# Patient Record
Sex: Male | Born: 1965 | Race: White | Hispanic: No | Marital: Married | Smoking: Never smoker
Health system: Southern US, Community
[De-identification: ages and names within clinical notes are randomized; demographics above are authoritative.]

---

## 1999-01-31 ENCOUNTER — Emergency Department (HOSPITAL_COMMUNITY): Admission: EM | Admit: 1999-01-31 | Discharge: 1999-01-31 | Payer: Self-pay | Admitting: Emergency Medicine

## 2002-09-06 ENCOUNTER — Ambulatory Visit (HOSPITAL_COMMUNITY): Admission: RE | Admit: 2002-09-06 | Discharge: 2002-09-06 | Payer: Self-pay | Admitting: Urology

## 2004-10-07 ENCOUNTER — Emergency Department (HOSPITAL_COMMUNITY): Admission: EM | Admit: 2004-10-07 | Discharge: 2004-10-08 | Payer: Self-pay | Admitting: Emergency Medicine

## 2005-11-11 ENCOUNTER — Encounter: Admission: RE | Admit: 2005-11-11 | Discharge: 2005-11-11 | Payer: Self-pay | Admitting: Family Medicine

## 2005-11-12 ENCOUNTER — Emergency Department (HOSPITAL_COMMUNITY): Admission: EM | Admit: 2005-11-12 | Discharge: 2005-11-12 | Payer: Self-pay | Admitting: Emergency Medicine

## 2005-11-24 ENCOUNTER — Encounter: Admission: RE | Admit: 2005-11-24 | Discharge: 2005-11-24 | Payer: Self-pay | Admitting: Neurology

## 2005-12-22 ENCOUNTER — Ambulatory Visit: Payer: Self-pay

## 2006-11-24 IMAGING — CT CT CHEST W/O CM
3 of 4 series · 18 of 31 positions shown, 20 images · non-contrast
Comparison: NONE

CLINICAL DATA: Irregular parenchymal density left upper lobe 
overlying  left 5th rib. 

CT CHEST WITHOUT INTRAVENOUS CONTRAST
TECHNIQUE: Multiple axial slices were obtained from 
the lung apex through the upper abdomen.  Lung, soft tissue, and 
bone window settings were obtained.

[Series 2: chest wo · axial · 0.71mm/px · z∈[+1372,+1612]mm · 8 of 65 slices shown, 10 images]
[im 9/65  mediastinal]
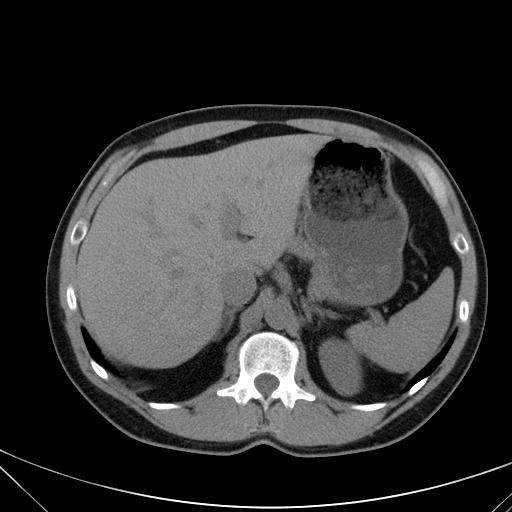
[im 9/65  lung]
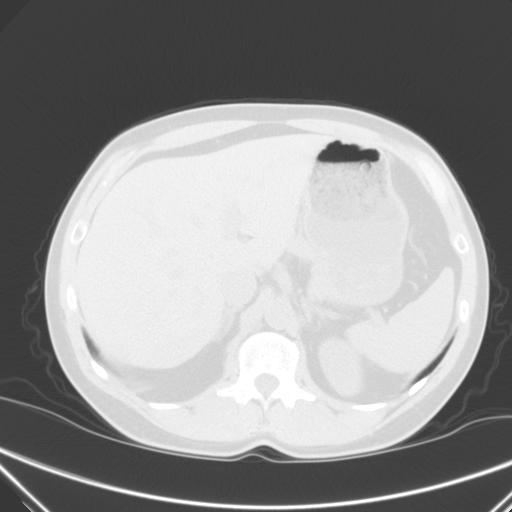
[im 17/65  lung]
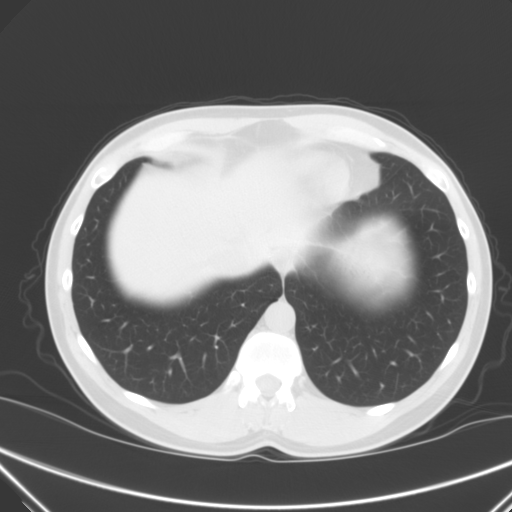
[im 25/65  lung]
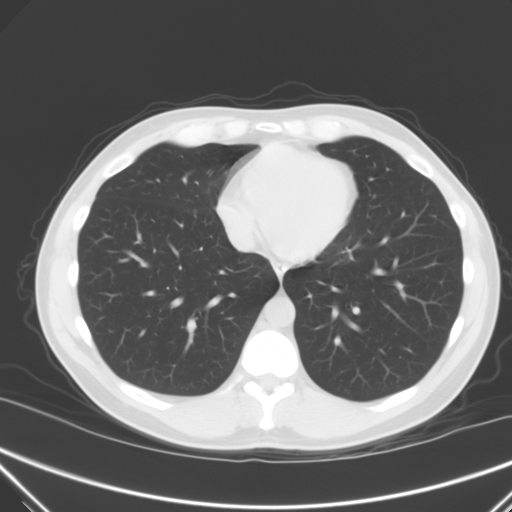
[im 31/65  lung]
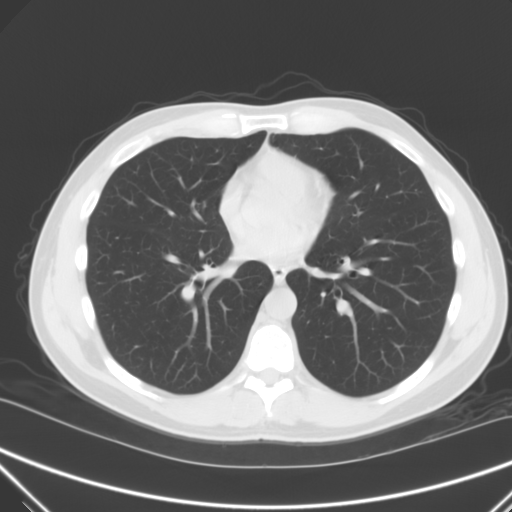
[im 33/65  mediastinal]
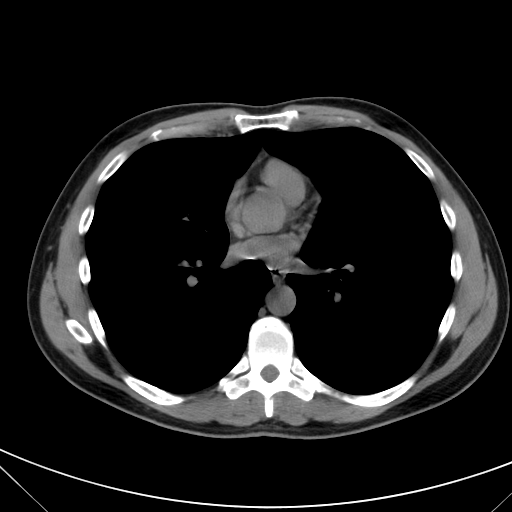
[im 33/65  lung]
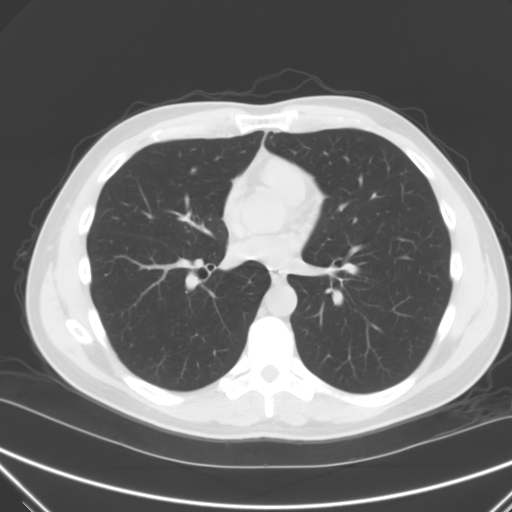
[im 41/65  lung]
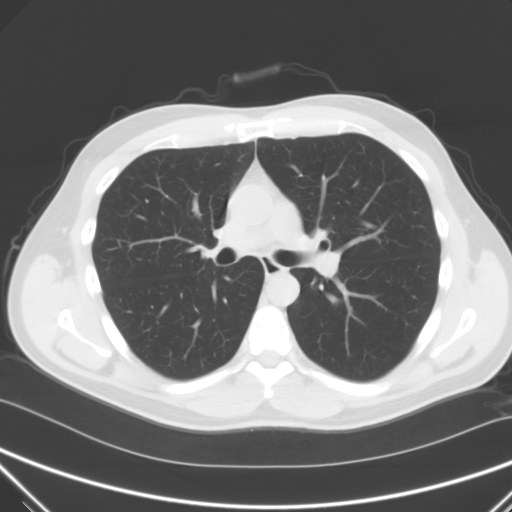
[im 49/65  lung]
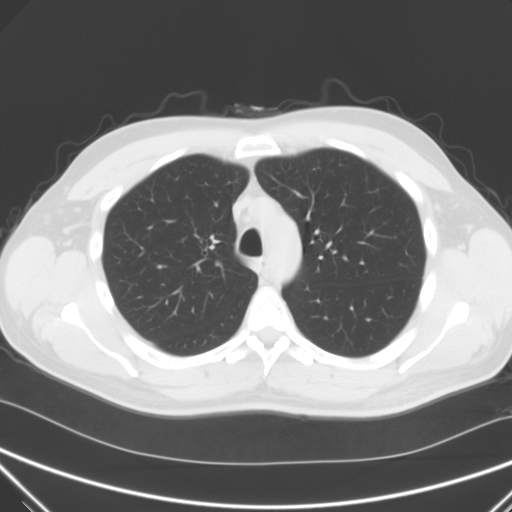
[im 57/65  lung]
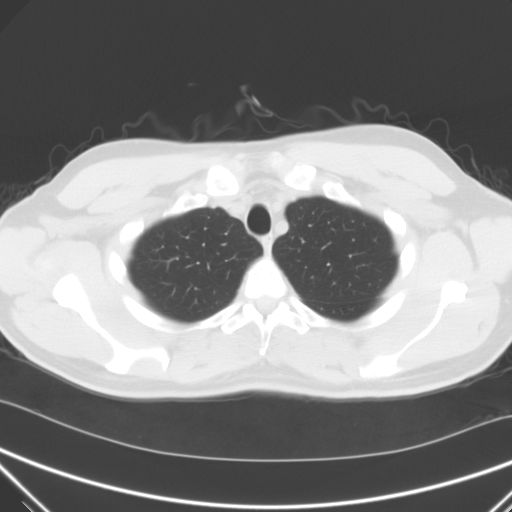

[Series 3: lung · axial · 0.71mm/px · z∈[+1386,+1432]mm · 2 of 62 slices shown]
[im 9/62  lung]
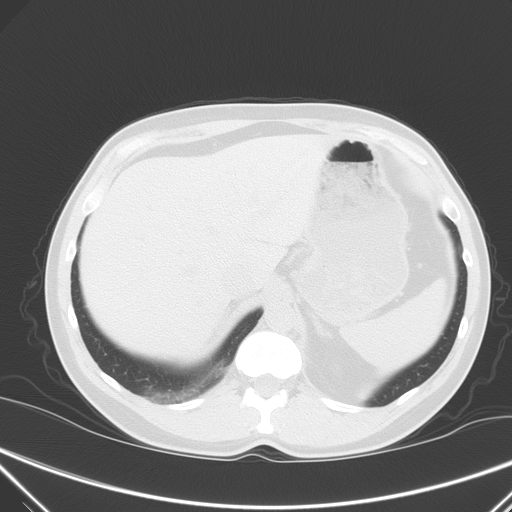
[im 18/62  lung]
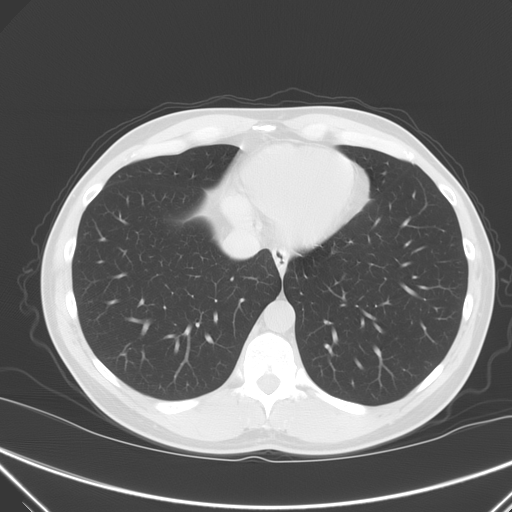

[Series 5: bone · axial · 0.71mm/px · z∈[+1372,+1612]mm · 8 of 65 slices shown]
[im 9/65  lung]
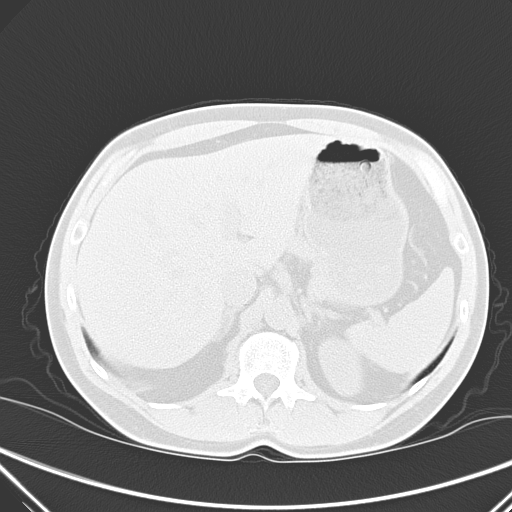
[im 17/65  lung]
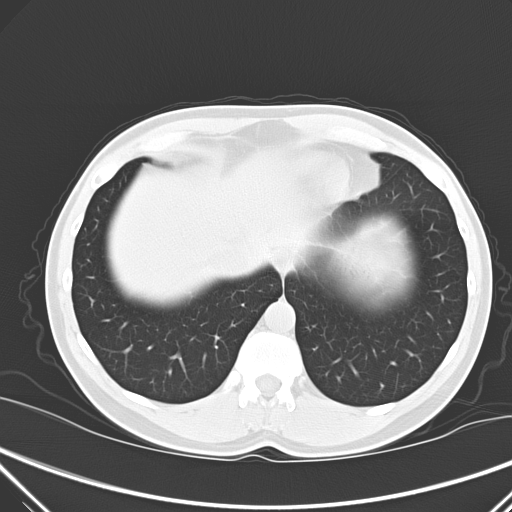
[im 25/65  lung]
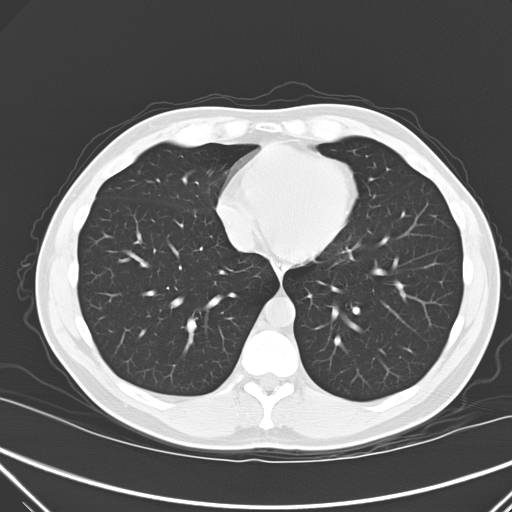
[im 31/65  lung]
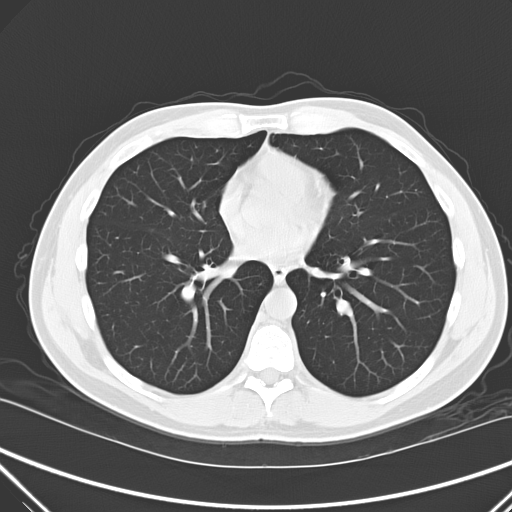
[im 33/65  lung]
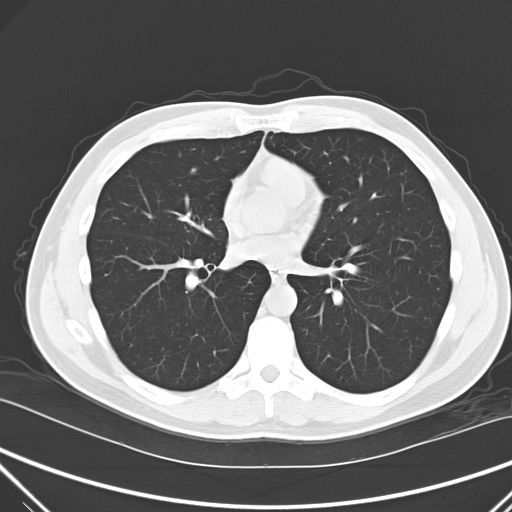
[im 41/65  lung]
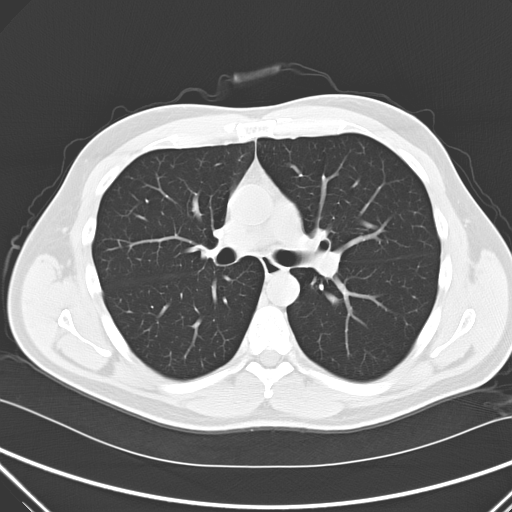
[im 49/65  lung]
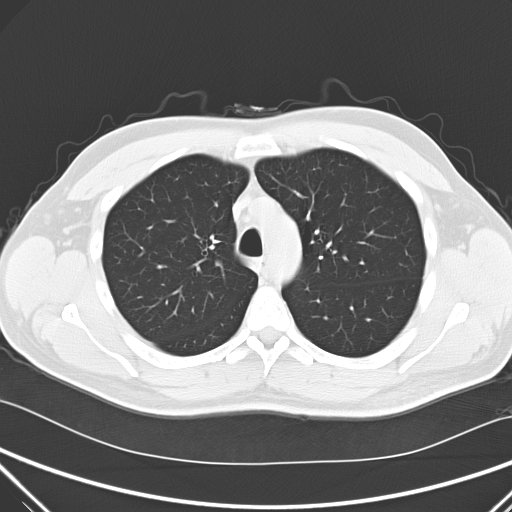
[im 57/65  lung]
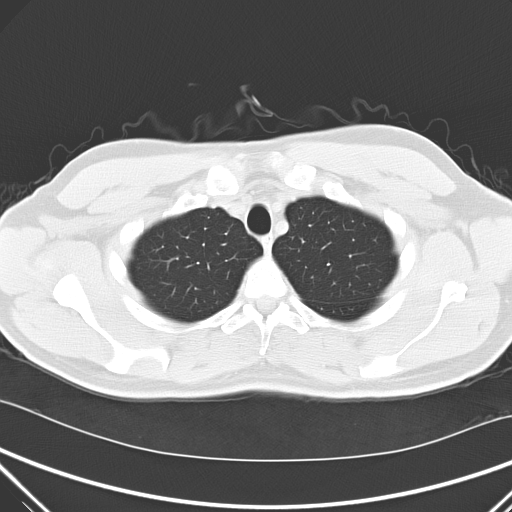

[18 of 31 positions shown; findings below may reference images not displayed]

FINDINGS: There is no evidence of axillary, 
mediastinal, or hilar or mass or adenopathy. No focal nodule, 
mass, infiltrate, edema, or effusion. The visualized portions of 
the upper abdominal structures are unremarkable. No lytic or 
blastic lesions.  No fractures are identified.
IMPRESSION: Negative CT of the chest without

## 2014-06-25 ENCOUNTER — Encounter (INDEPENDENT_AMBULATORY_CARE_PROVIDER_SITE_OTHER): Payer: Self-pay

## 2014-06-25 ENCOUNTER — Ambulatory Visit (INDEPENDENT_AMBULATORY_CARE_PROVIDER_SITE_OTHER): Payer: Self-pay | Admitting: Family Medicine

## 2014-06-25 VITALS — BP 170/84 | HR 83 | Ht 69.0 in | Wt 162.0 lb

## 2014-06-25 DIAGNOSIS — M84361A Stress fracture, right tibia, initial encounter for fracture: Secondary | ICD-10-CM

## 2014-06-25 NOTE — Patient Instructions (Signed)
You have a distal tibia stress fracture. See the protocol for specifics. Follow up with me in 2 weeks.

## 2014-06-26 DIAGNOSIS — M84362G Stress fracture, left tibia, subsequent encounter for fracture with delayed healing: Secondary | ICD-10-CM | POA: Insufficient documentation

## 2014-06-26 DIAGNOSIS — M84361A Stress fracture, right tibia, initial encounter for fracture: Secondary | ICD-10-CM | POA: Insufficient documentation

## 2014-06-26 NOTE — Progress Notes (Signed)
PCP: Dr. Alessandra BevelsVaughn  Subjective:   HPI: Patient is a 48 y.o. male here for right leg pain.  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 He could barely walk the next day due to the pain. He took 2 weeks off and had treatment done by a chiropractor as well with laser. Has not improved however. Pain bothers a lot with walking. Feels it medial right tibia and radiates down into ankle at times. No history of stress fracture.  No past medical history on file.  No current outpatient prescriptions on file prior to visit.   No current facility-administered medications on file prior to visit.    No past surgical history on file.  Not on File  History   Social History  . Marital Status: Married    Spouse Name: N/A    Number of Children: N/A  . Years of Education: N/A   Occupational History  . Not on file.   Social History Main Topics  . Smoking status: Not on file  . Smokeless tobacco: Not on file  . Alcohol Use: Not on file  . Drug Use: Not on file  . Sexual Activity: Not on file   Other Topics Concern  . Not on file   Social History Narrative  . No narrative on file    No family history on file.  BP 170/84 mmHg  Pulse 83  Ht 5\' 9"  (1.753 m)  Wt 162 lb (73.483 kg)  BMI 23.91 kg/m2  Review of Systems: See HPI above.    Objective:  Physical Exam:  Gen: NAD  Right lower leg: Mild localized swelling between mid and distal 1/3rds of tibia.  Firm ?callus felt here.  No bruising, other deformity. TTP in same location above. FROM ankle with mild pain on full dorsiflexion. Stength 5/5 all ankle motions. NVI distally.  MSK u/s:  Cortical irregularity of distal tibia with small callus, edema overlying cortex, and neovascularity confirming stress fracture in this location.  Images saved.    Assessment & Plan:  1. Right distal tibia stress fracture - Aircast brace provided to wear when ambulatory.  Protocol  reviewed and printed for him.  No running for next 2 weeks.  Icing, calf/heel raises. Tylenol or nsaids if needed.  Ok to cross train with low resistance cycling or swimming unless painful.  F/u in 2 weeks.

## 2014-06-26 NOTE — Assessment & Plan Note (Signed)
Right distal tibia stress fracture - Aircast brace provided to wear when ambulatory.  Protocol reviewed and printed for him.  No running for next 2 weeks.  Icing, calf/heel raises. Tylenol or nsaids if needed.  Ok to cross train with low resistance cycling or swimming unless painful.  F/u in 2 weeks.

## 2014-07-10 ENCOUNTER — Encounter: Payer: Self-pay | Admitting: Family Medicine

## 2014-07-10 ENCOUNTER — Ambulatory Visit (INDEPENDENT_AMBULATORY_CARE_PROVIDER_SITE_OTHER): Payer: Self-pay | Admitting: Family Medicine

## 2014-07-10 VITALS — BP 162/99 | HR 73 | Ht 69.0 in | Wt 162.0 lb

## 2014-07-10 DIAGNOSIS — M84361D Stress fracture, right tibia, subsequent encounter for fracture with routine healing: Secondary | ICD-10-CM

## 2014-07-10 NOTE — Progress Notes (Signed)
PCP: Dr. Alessandra BevelsVaughn  Subjective:   HPI: Patient is a 49 y.o. male here for right leg pain.  06/25/13: Patient is a runner who reports about a month ago he started getting pain right shin during a 5K. He could barely walk the next day due to the pain. He took 2 weeks off and had treatment done by a chiropractor as well with laser. Has not improved however. Pain bothers a lot with walking. Feels it medial right tibia and radiates down into ankle at times. No history of stress fracture.  07/10/13: Patient reports he has improved some since last visit. Does not have pain with ambulation. Some swelling. Tender to touch on stress fracture site. Initial aircast was not holding air - had to exchange.  No past medical history on file.  No current outpatient prescriptions on file prior to visit.   No current facility-administered medications on file prior to visit.    No past surgical history on file.  No Known Allergies  History   Social History  . Marital Status: Married    Spouse Name: N/A    Number of Children: N/A  . Years of Education: N/A   Occupational History  . Not on file.   Social History Main Topics  . Smoking status: Never Smoker   . Smokeless tobacco: Not on file  . Alcohol Use: Not on file  . Drug Use: Not on file  . Sexual Activity: Not on file   Other Topics Concern  . Not on file   Social History Narrative  . No narrative on file    No family history on file.  BP 162/99 mmHg  Pulse 73  Ht 5\' 9"  (1.753 m)  Wt 162 lb (73.483 kg)  BMI 23.91 kg/m2  Review of Systems: See HPI above.    Objective:  Physical Exam:  Gen: NAD  Right lower leg: Mild localized swelling between mid and distal 1/3rds of tibia. Firm callus felt here.  No bruising, other deformity. TTP in same location above. FROM ankle. Stength 5/5 all ankle motions. NVI distally.  MSK u/s:  Cortical irregularity of distal tibia with small callus, edema overlying cortex, and  neovascularity confirming stress fracture in this location.  More callus formation compared to images from last visit.  Images saved.    Assessment & Plan:  1. Right distal tibia stress fracture - Believe it's a little soon to start him on the 400/400 walk/jog portion of the protocol as his pain is up to 3/10 with a lot of activity.  Continue using aircast for long walking but try without it in the house.  Still no running but in 1 week if pain is 1-2 or absent can try week 3 of the protocol which involves 44928m jog/42128m walk x 4 in the long aircast.  Can progress if pain does not worsen otherwise continue to rest until he sees me back.  Given amount of callus already suspect he will do well with this.  Icing, calf/heel raises. Tylenol or nsaids if needed.  Ok to cross train with low resistance cycling or swimming unless painful.  F/u in 2-3 weeks.

## 2014-07-10 NOTE — Assessment & Plan Note (Signed)
Believe it's a little soon to start him on the 400/400 walk/jog portion of the protocol as his pain is up to 3/10 with a lot of activity.  Continue using aircast for long walking but try without it in the house.  Still no running but in 1 week if pain is 1-2 or absent can try week 3 of the protocol which involves 44422m jog/42822m walk x 4 in the long aircast.  Can progress if pain does not worsen otherwise continue to rest until he sees me back.  Given amount of callus already suspect he will do well with this.  Icing, calf/heel raises. Tylenol or nsaids if needed.  Ok to cross train with low resistance cycling or swimming unless painful.  F/u in 2-3 weeks.

## 2014-07-10 NOTE — Patient Instructions (Signed)
Try going without the aircast around the house. Still wear it when you're going out and for long walking. Wear the aircast when you start the walk:jog protocol in 1 week. Follow the instructions. Ideally your pain with this stays at or below a 3 on a scale of 1-10. Follow up with me in 2-3 weeks.

## 2014-07-24 ENCOUNTER — Encounter: Payer: Self-pay | Admitting: Family Medicine

## 2014-07-24 ENCOUNTER — Ambulatory Visit (INDEPENDENT_AMBULATORY_CARE_PROVIDER_SITE_OTHER): Payer: Self-pay | Admitting: Family Medicine

## 2014-07-24 VITALS — BP 162/95 | HR 85 | Ht 69.0 in | Wt 163.0 lb

## 2014-07-24 DIAGNOSIS — M84361D Stress fracture, right tibia, subsequent encounter for fracture with routine healing: Secondary | ICD-10-CM

## 2014-07-24 NOTE — Patient Instructions (Signed)
You are healing very well from your stress fracture. At this point you can start at week 3 on the protocol. Follow instructions on the protocol - wearing the aircast when you exercise. Can try going without the aircast for regular walking but keep it with you. Icing as needed, tylenol/motrin if needed. Consider trying brisk walk instead of jog for that portion on your first time exercising. Follow up with me in 4 weeks. Call me with any questions or concerns.

## 2014-07-24 NOTE — Assessment & Plan Note (Signed)
Right distal tibia stress fracture - Believe at this point he can start the 400/400 walk/jog portion of protocol, week 3.  He can even start with brisk walking instead of jogging in the aircast.  Can try without aircast for daily activities given he no longer has pain when walking without this.  Progress per protocol.  Icing, calf/heel raises. Tylenol or nsaids if needed.  Ok to cross train with low resistance cycling or swimming on off days.  F/u in 4 weeks.

## 2014-07-24 NOTE — Progress Notes (Signed)
PCP: Dr. Alessandra BevelsVaughn  Subjective:   HPI: Patient is a 49 y.o. male here for right leg pain.  06/25/14: Patient is a runner who reports about a month ago he started getting pain right shin during a 5K. He could barely walk the next day due to the pain. He took 2 weeks off and had treatment done by a chiropractor as well with laser. Has not improved however. Pain bothers a lot with walking. Feels it medial right tibia and radiates down into ankle at times. No history of stress fracture.  07/10/14: Patient reports he has improved some since last visit. Does not have pain with ambulation. Some swelling. Tender to touch on stress fracture site. Initial aircast was not holding air - had to exchange.  07/24/14: Patient reports he is doing well. No pain walking without aircast now. Is slightly tender to the touch. Has not tried to start running in the aircast yet. Swelling some improved.  No past medical history on file.  No current outpatient prescriptions on file prior to visit.   No current facility-administered medications on file prior to visit.    No past surgical history on file.  No Known Allergies  History   Social History  . Marital Status: Married    Spouse Name: N/A    Number of Children: N/A  . Years of Education: N/A   Occupational History  . Not on file.   Social History Main Topics  . Smoking status: Never Smoker   . Smokeless tobacco: Not on file  . Alcohol Use: Not on file  . Drug Use: Not on file  . Sexual Activity: Not on file   Other Topics Concern  . Not on file   Social History Narrative    No family history on file.  BP 162/95 mmHg  Pulse 85  Ht 5\' 9"  (1.753 m)  Wt 163 lb (73.936 kg)  BMI 24.06 kg/m2  Review of Systems: See HPI above.    Objective:  Physical Exam:  Gen: NAD  Right lower leg: Mild localized swelling between mid and distal 1/3rds of tibia. Firm callus felt here.  No bruising, other deformity. Minimal TTP in same  location above - much improved. FROM ankle. Strength 5/5 all ankle motions. NVI distally. No pain loading the heel.  MSK u/s:  Cortical irregularity of distal tibia with small callus, edema overlying cortex, and neovascularity confirming stress fracture in this location.  Excellent callus formation. Images saved.    Assessment & Plan:  1. Right distal tibia stress fracture - Believe at this point he can start the 400/400 walk/jog portion of protocol, week 3.  He can even start with brisk walking instead of jogging in the aircast.  Can try without aircast for daily activities given he no longer has pain when walking without this.  Progress per protocol.  Icing, calf/heel raises. Tylenol or nsaids if needed.  Ok to cross train with low resistance cycling or swimming on off days.  F/u in 4 weeks.

## 2014-08-21 ENCOUNTER — Encounter: Payer: Self-pay | Admitting: Family Medicine

## 2014-08-21 ENCOUNTER — Ambulatory Visit: Payer: Self-pay | Admitting: Family Medicine

## 2014-08-21 ENCOUNTER — Encounter (INDEPENDENT_AMBULATORY_CARE_PROVIDER_SITE_OTHER): Payer: Self-pay

## 2014-08-21 ENCOUNTER — Ambulatory Visit (INDEPENDENT_AMBULATORY_CARE_PROVIDER_SITE_OTHER): Payer: Self-pay | Admitting: Family Medicine

## 2014-08-21 VITALS — BP 172/103 | HR 69 | Ht 69.0 in | Wt 163.0 lb

## 2014-08-21 DIAGNOSIS — M84361D Stress fracture, right tibia, subsequent encounter for fracture with routine healing: Secondary | ICD-10-CM

## 2014-08-21 NOTE — Patient Instructions (Signed)
I would recommend brisk walking every other day (4-4.5 mph) for about 10 minutes to start with. Increase by 5 minutes every other day as long as you're not getting pain at the fracture site. Don't attempt to jog for another 2 weeks. At that time you can start with 1:1 walk:jog every other day. Increase total exercise time by 5 minutes each time (10 minutes first time doing walk:jog - then 15, 20, etc.) Also each time increase the jog interval (so 1:1 walk: jog - then 1:2 minutes walk: jog second run; 1:3 walk:jog third run, etc). Follow up with me in 4 weeks.

## 2014-08-22 NOTE — Progress Notes (Signed)
PCP: Dr. Alessandra BevelsVaughn  Subjective:   HPI: Patient is a 49 y.o. male here for right leg pain.  06/25/14: Patient is a runner who reports about a month ago he started getting pain right shin during a 5K. He could barely walk the next day due to the pain. He took 2 weeks off and had treatment done by a chiropractor as well with laser. Has not improved however. Pain bothers a lot with walking. Feels it medial right tibia and radiates down into ankle at times. No history of stress fracture.  07/10/14: Patient reports he has improved some since last visit. Does not have pain with ambulation. Some swelling. Tender to touch on stress fracture site. Initial aircast was not holding air - had to exchange.  07/24/14: Patient reports he is doing well. No pain walking without aircast now. Is slightly tender to the touch. Has not tried to start running in the aircast yet. Swelling some improved.  2/16: Patient reports he's not having any pain or tenderness. Has not tried running on this yet though even in aircast. Aircast is uncomfortable if he tries to exercise in this. Able to cycle without any pain.  No past medical history on file.  No current outpatient prescriptions on file prior to visit.   No current facility-administered medications on file prior to visit.    No past surgical history on file.  No Known Allergies  History   Social History  . Marital Status: Married    Spouse Name: N/A  . Number of Children: N/A  . Years of Education: N/A   Occupational History  . Not on file.   Social History Main Topics  . Smoking status: Never Smoker   . Smokeless tobacco: Not on file  . Alcohol Use: Not on file  . Drug Use: Not on file  . Sexual Activity: Not on file   Other Topics Concern  . Not on file   Social History Narrative    No family history on file.  BP 172/103 mmHg  Pulse 69  Ht 5\' 9"  (1.753 m)  Wt 163 lb (73.936 kg)  BMI 24.06 kg/m2  Review of  Systems: See HPI above.    Objective:  Physical Exam:  Gen: NAD  Right lower leg: Firm callus between mid and distal 1/3rds of tibia. No bruising, other deformity. No tenderness now in same location above. FROM ankle. Strength 5/5 all ankle motions. NVI distally. No pain loading the heel.  MSK u/s:  Large callus of right distal tibia without edema now.  Does still have some neovascularity over the callus.  Images saved.    Assessment & Plan:  1. Right distal tibia stress fracture - Clinically significantly better though has not tested this with weight bearing activities.  He cannot tolerate exercising in the aircast unfortunately - more pain with this.  Advised to start with brisk walking but no running next 2 weeks then attempt very light walk: jog program to test this out - stop if any pain and rest, call me.  See instructions for other specifics.  F/u in 4 weeks.

## 2014-08-22 NOTE — Assessment & Plan Note (Signed)
Right distal tibia stress fracture - Clinically significantly better though has not tested this with weight bearing activities.  He cannot tolerate exercising in the aircast unfortunately - more pain with this.  Advised to start with brisk walking but no running next 2 weeks then attempt very light walk: jog program to test this out - stop if any pain and rest, call me.  See instructions for other specifics.  F/u in 4 weeks.

## 2014-09-17 ENCOUNTER — Encounter: Payer: Self-pay | Admitting: Family Medicine

## 2014-09-17 ENCOUNTER — Ambulatory Visit (INDEPENDENT_AMBULATORY_CARE_PROVIDER_SITE_OTHER): Payer: Self-pay | Admitting: Family Medicine

## 2014-09-17 VITALS — BP 146/90 | HR 76 | Ht 69.0 in | Wt 160.0 lb

## 2014-09-17 DIAGNOSIS — M84361D Stress fracture, right tibia, subsequent encounter for fracture with routine healing: Secondary | ICD-10-CM

## 2014-09-18 NOTE — Assessment & Plan Note (Signed)
Right distal tibia stress fracture - Clinically healed and callus seen last visit.  Able to run a 5k without pain.  Cleared for running and all sports without restrictions.  F/u prn.

## 2014-09-18 NOTE — Progress Notes (Signed)
PCP: Dr. Alessandra BevelsVaughn  Subjective:   HPI: Patient is a 49 y.o. male here for right leg pain.  06/25/14: Patient is a runner who reports about a month ago he started getting pain right shin during a 5K. He could barely walk the next day due to the pain. He took 2 weeks off and had treatment done by a chiropractor as well with laser. Has not improved however. Pain bothers a lot with walking. Feels it medial right tibia and radiates down into ankle at times. No history of stress fracture.  07/10/14: Patient reports he has improved some since last visit. Does not have pain with ambulation. Some swelling. Tender to touch on stress fracture site. Initial aircast was not holding air - had to exchange.  07/24/14: Patient reports he is doing well. No pain walking without aircast now. Is slightly tender to the touch. Has not tried to start running in the aircast yet. Swelling some improved.  2/16: Patient reports he's not having any pain or tenderness. Has not tried running on this yet though even in aircast. Aircast is uncomfortable if he tries to exercise in this. Able to cycle without any pain.  3/14: Patient is without any pain. Was able to jog a 5k without any pain as well. No other complaints.  No past medical history on file.  No current outpatient prescriptions on file prior to visit.   No current facility-administered medications on file prior to visit.    No past surgical history on file.  No Known Allergies  History   Social History  . Marital Status: Married    Spouse Name: N/A  . Number of Children: N/A  . Years of Education: N/A   Occupational History  . Not on file.   Social History Main Topics  . Smoking status: Never Smoker   . Smokeless tobacco: Not on file  . Alcohol Use: Not on file  . Drug Use: Not on file  . Sexual Activity: Not on file   Other Topics Concern  . Not on file   Social History Narrative    No family history on file.  BP  146/90 mmHg  Pulse 76  Ht 5\' 9"  (1.753 m)  Wt 160 lb (72.576 kg)  BMI 23.62 kg/m2  Review of Systems: See HPI above.    Objective:  Physical Exam:  Gen: NAD  Right lower leg: Firm callus between mid and distal 1/3rds of tibia. No bruising, other deformity. No tenderness now in same location above. FROM ankle. Strength 5/5 all ankle motions. NVI distally. No pain loading the heel. Negative hop test.    Assessment & Plan:  1. Right distal tibia stress fracture - Clinically healed and callus seen last visit.  Able to run a 5k without pain.  Cleared for running and all sports without restrictions.  F/u prn.

## 2019-05-03 ENCOUNTER — Ambulatory Visit: Payer: Self-pay

## 2019-05-03 ENCOUNTER — Other Ambulatory Visit: Payer: Self-pay

## 2019-05-03 ENCOUNTER — Ambulatory Visit (INDEPENDENT_AMBULATORY_CARE_PROVIDER_SITE_OTHER): Payer: Self-pay | Admitting: Family Medicine

## 2019-05-03 VITALS — BP 142/98 | Ht 68.0 in | Wt 163.0 lb

## 2019-05-03 DIAGNOSIS — R2242 Localized swelling, mass and lump, left lower limb: Secondary | ICD-10-CM

## 2019-05-03 DIAGNOSIS — M79662 Pain in left lower leg: Secondary | ICD-10-CM

## 2019-05-05 ENCOUNTER — Encounter: Payer: Self-pay | Admitting: Family Medicine

## 2019-05-05 NOTE — Progress Notes (Signed)
PCP: No primary care provider on file.  Subjective:   HPI: Patient is a 53 y.o. male here for left shin pain, left knee swelling.  Patient reports he started to get left lower leg pain on 10/2 - recalls doing some jumps during [employer] training though no acute injury. He ran a couple miles after this and had soreness distal medial left lower leg. Pain is around 2-3/10 but up to 7/10 and sharp if he tries to run. Had radiographs at ortho office that were negative. Can walk normal but feels stiffness here. A couple weeks ago was last time he ran - tried to do 5k but had pain. No swelling, skin changes of lower leg. He does have focal swollen area of left knee just above kneecap laterally. Feels it's gone down some past couple weeks and not really painful.  History reviewed. No pertinent past medical history.  No current outpatient medications on file prior to visit.   No current facility-administered medications on file prior to visit.     History reviewed. No pertinent surgical history.  No Known Allergies  Social History   Socioeconomic History  . Marital status: Married    Spouse name: Not on file  . Number of children: Not on file  . Years of education: Not on file  . Highest education level: Not on file  Occupational History  . Not on file  Social Needs  . Financial resource strain: Not on file  . Food insecurity    Worry: Not on file    Inability: Not on file  . Transportation needs    Medical: Not on file    Non-medical: Not on file  Tobacco Use  . Smoking status: Never Smoker  Substance and Sexual Activity  . Alcohol use: Not on file  . Drug use: Not on file  . Sexual activity: Not on file  Lifestyle  . Physical activity    Days per week: Not on file    Minutes per session: Not on file  . Stress: Not on file  Relationships  . Social Musician on phone: Not on file    Gets together: Not on file    Attends religious service: Not on file   Active member of club or organization: Not on file    Attends meetings of clubs or organizations: Not on file    Relationship status: Not on file  . Intimate partner violence    Fear of current or ex partner: Not on file    Emotionally abused: Not on file    Physically abused: Not on file    Forced sexual activity: Not on file  Other Topics Concern  . Not on file  Social History Narrative  . Not on file    History reviewed. No pertinent family history.  BP (!) 142/98   Ht 5\' 8"  (1.727 m)   Wt 163 lb (73.9 kg)   BMI 24.78 kg/m   Review of Systems: See HPI above.     Objective:  Physical Exam:  Gen: NAD, comfortable in exam room  Left knee: Focal mobile area of swelling lateral to quad tendon and feels superficial.  No bruising, other deformity. No TTP. FROM with 5/5 strength flexion and extension. Negative ant/post drawers. Negative valgus/varus testing. Negative lachmans. Negative mcmurrays, apleys, patellar apprehension. NV intact distally.  Right knee: No deformity, instability. FROM with 5/5 strength. No tenderness to palpation. NVI distally.  Left lower leg: No deformity, swelling, bruising. FROM  with 5/5 strength all motions ankle. Tenderness to palpation over tibia distally. NVI distally. Positive hop test Negative fulcrum test.   MSK u/s left lower leg:  Cortical irregularity with overlying edema noted distal tibia consistent with stress fracture.    MSK u/s left knee:  Seroma/cyst noted though with obvious communication with knee joint.  No vascularity to this.  Area is anechoic.  Assessment & Plan:  1. Left distal tibia stress fracture - printed out 12 week protocol for him to follow starting today.  He still has his long aircast to use and will bring this in if he's unable to pump up the air bladders.  Icing, tylenol if needed.  F/u in 6 weeks but check in to let us know if he has any problems following protocol.  We discussed consideration of  inserts/orthotics in future given this is his second stress fracture.  2. Left knee mass - consistent with seroma or cyst.  Reassured patient.  Icing, compression.

## 2019-06-12 ENCOUNTER — Other Ambulatory Visit: Payer: Self-pay

## 2019-06-12 ENCOUNTER — Ambulatory Visit (INDEPENDENT_AMBULATORY_CARE_PROVIDER_SITE_OTHER): Payer: Self-pay | Admitting: Family Medicine

## 2019-06-12 ENCOUNTER — Encounter: Payer: Self-pay | Admitting: Family Medicine

## 2019-06-12 VITALS — BP 158/102 | Ht 68.0 in | Wt 160.0 lb

## 2019-06-12 DIAGNOSIS — M84362G Stress fracture, left tibia, subsequent encounter for fracture with delayed healing: Secondary | ICD-10-CM

## 2019-06-12 NOTE — Assessment & Plan Note (Signed)
Patient has been doing all tertiary exercises, has not been using NSAIDs, has been stopping all running activities once pain develops but has not been regressing 1 week in the 12-week return to activity protocol that was prescribed.  He said he had no worsening of the issue but that he cannot usually get more than a few 100 yards and then he run before it starts to hurt and he needs to stop.  Pain is still located approximately two thirds down the distal left tibia on the medial side.  He has had no significant swelling or erythema and no individual moment of new injury.  On ultrasound the callus formation at site of stress does appear to be progressing appropriately with appropriate vascularity  Recommend 2-week rest and then return to the 12-week protocol.  Can use NSAIDs over-the-counter for inflammation and pain.

## 2019-06-12 NOTE — Patient Instructions (Signed)
For the next 2 weeks cycling or swimming only - slowly increase resistance on the bike. Wear the aircast when up and walking around though. Icing, tylenol if needed. In 2 weeks restart the protocol slowly in the aircast. Follow up with me in 4 weeks.

## 2019-06-12 NOTE — Progress Notes (Signed)
    Subjective:  Adam Townsend is a 53 y.o. male who presents to the [facility] today with a chief complaint of left leg pain.   HPI: Stress fracture of left tibia with delayed healing Patient has been doing all strengthening exercises, has not been using NSAIDs, has been stopping all running activities once pain develops but has not been regressing 1 week in the 12-week return to activity protocol that was prescribed.  He said he had no worsening of the issue but that he cannot usually get more than a few 100 yards and then he run before it starts to hurt and he needs to stop.  Pain is still located approximately two thirds down the distal left tibia on the medial side.  He has had no significant swelling or erythema and no individual moment of new injury.   Objective:  Physical Exam: BP (!) 158/102   Ht 5\' 8"  (1.727 m)   Wt 160 lb (72.6 kg)   BMI 24.33 kg/m   Gen: NAD, resting comfortably Pulm: NWOB, no cough MSK: No skin changes or significant edema over the distal tibia where patient says pain is, it is tender to the touch but there is no palpable abnormality.  FROM ankle and knee with normal strength.  Mild discomfort with hop test. Neuro: grossly normal, moves all extremities Psych: Normal affect and thought content  MSK u/s left lower leg:  Large callus formation in area of his stress fracture with minimal overlying edema.  Neovascularity in area.  Increased callus formation compared to prior ultrasound.  Assessment/Plan:  Stress fracture of left tibia with delayed healing Patient struggling to return to running. He has had no significant swelling or erythema and no individual moment of new injury.  On ultrasound the callus formation at site of stress does appear to be progressing appropriately with appropriate vascularity - healing by imaging despite his struggles to return to running.  Recommend 2-week rest and then return to the 12-week protocol.  Can use NSAIDs over-the-counter for  inflammation and pain.   Sherene Sires, DO FAMILY MEDICINE RESIDENT - PGY3 06/12/2019 10:58 AM

## 2019-07-10 ENCOUNTER — Encounter: Payer: Self-pay | Admitting: Family Medicine

## 2019-07-10 ENCOUNTER — Other Ambulatory Visit: Payer: Self-pay

## 2019-07-10 ENCOUNTER — Ambulatory Visit (INDEPENDENT_AMBULATORY_CARE_PROVIDER_SITE_OTHER): Payer: Self-pay | Admitting: Family Medicine

## 2019-07-10 VITALS — BP 138/98 | Ht 68.0 in | Wt 162.0 lb

## 2019-07-10 DIAGNOSIS — M84362G Stress fracture, left tibia, subsequent encounter for fracture with delayed healing: Secondary | ICD-10-CM

## 2019-07-10 NOTE — Progress Notes (Signed)
PCP: No primary care provider on file.  Subjective:   HPI: Patient is a 54 y.o. male here for left shin pain, left knee swelling.  10/28: Patient reports he started to get left lower leg pain on 10/2 - recalls doing some jumps during [employer] training though no acute injury. He ran a couple miles after this and had soreness distal medial left lower leg. Pain is around 2-3/10 but up to 7/10 and sharp if he tries to run. Had radiographs at ortho office that were negative. Can walk normal but feels stiffness here. A couple weeks ago was last time he ran - tried to do 5k but had pain. No swelling, skin changes of lower leg. He does have focal swollen area of left knee just above kneecap laterally. Feels it's gone down some past couple weeks and not really painful.  12/7: Patient has been doing all strengthening exercises, has not been using NSAIDs, has been stopping all running activities once pain develops but has not been regressing 1 week in the 12-week return to activity protocol that was prescribed.  He said he had no worsening of the issue but that he cannot usually get more than a few 100 yards and then he run before it starts to hurt and he needs to stop.  Pain is still located approximately two thirds down the distal left tibia on the medial side.  He has had no significant swelling or erythema and no individual moment of new injury.   07/10/19: Patient reports he's making progress now and able to run a half mile without pain. Still wearing brace though feels he does not need this. Pain is 0/10 and not tender to the touch now. Not requiring medication for this. No swelling.  History reviewed. No pertinent past medical history.  No current outpatient medications on file prior to visit.   No current facility-administered medications on file prior to visit.    History reviewed. No pertinent surgical history.  No Known Allergies  Social History   Socioeconomic History  . Marital  status: Married    Spouse name: Not on file  . Number of children: Not on file  . Years of education: Not on file  . Highest education level: Not on file  Occupational History  . Not on file  Tobacco Use  . Smoking status: Never Smoker  Substance and Sexual Activity  . Alcohol use: Not on file  . Drug use: Not on file  . Sexual activity: Not on file  Other Topics Concern  . Not on file  Social History Narrative  . Not on file   Social Determinants of Health   Financial Resource Strain:   . Difficulty of Paying Living Expenses: Not on file  Food Insecurity:   . Worried About Programme researcher, broadcasting/film/video in the Last Year: Not on file  . Ran Out of Food in the Last Year: Not on file  Transportation Needs:   . Lack of Transportation (Medical): Not on file  . Lack of Transportation (Non-Medical): Not on file  Physical Activity:   . Days of Exercise per Week: Not on file  . Minutes of Exercise per Session: Not on file  Stress:   . Feeling of Stress : Not on file  Social Connections:   . Frequency of Communication with Friends and Family: Not on file  . Frequency of Social Gatherings with Friends and Family: Not on file  . Attends Religious Services: Not on file  . Active  Member of Clubs or Organizations: Not on file  . Attends Archivist Meetings: Not on file  . Marital Status: Not on file  Intimate Partner Violence:   . Fear of Current or Ex-Partner: Not on file  . Emotionally Abused: Not on file  . Physically Abused: Not on file  . Sexually Abused: Not on file    History reviewed. No pertinent family history.  BP (!) 138/98   Ht 5\' 8"  (1.727 m)   Wt 162 lb (73.5 kg)   BMI 24.63 kg/m   Review of Systems: See HPI above.     Objective:  Physical Exam:  Gen: NAD, comfortable in exam room  Left lower leg: No deformity, swelling. FROM with 5/5 strength. No tenderness to palpation. NVI distally. Negative hop test.  MSK u/s left lower leg:  Excellent callus  formation with mild neovascularity over distal tibia.    Assessment & Plan:  1. Left distal tibia stress fracture - Clinically improving about 8 weeks into protocol.  Advised to continue with this next 4 weeks.  Icing, tylenol only if needed.  Wait about 4 weeks to return to agility exercises - continue to advance jogging mileage before he works on speed.  F/u in 1 month or as needed.

## 2019-11-15 ENCOUNTER — Other Ambulatory Visit: Payer: Self-pay

## 2019-11-15 ENCOUNTER — Encounter: Payer: Self-pay | Admitting: Family Medicine

## 2019-11-15 ENCOUNTER — Ambulatory Visit: Payer: Self-pay

## 2019-11-15 ENCOUNTER — Ambulatory Visit (INDEPENDENT_AMBULATORY_CARE_PROVIDER_SITE_OTHER): Payer: Self-pay | Admitting: Family Medicine

## 2019-11-15 VITALS — BP 152/94 | Ht 69.0 in | Wt 162.0 lb

## 2019-11-15 DIAGNOSIS — R2242 Localized swelling, mass and lump, left lower limb: Secondary | ICD-10-CM

## 2019-11-15 NOTE — Progress Notes (Signed)
PCP: Jeanann Lewandowsky, MD  Subjective:   HPI: Patient is a 54 y.o. male here for left knee mass.  Patient reports several months ago he recalls appearance of superficial lateral left knee mass a day after exercising. No acute injury during this though was jumping, squatting. He currently has no pain with the mass but it has become more pronounced and noticeable. No warmth, redness, drainage. No catching, locking.  History reviewed. No pertinent past medical history.  No current outpatient medications on file prior to visit.   No current facility-administered medications on file prior to visit.    History reviewed. No pertinent surgical history.  No Known Allergies  Social History   Socioeconomic History  . Marital status: Married    Spouse name: Not on file  . Number of children: Not on file  . Years of education: Not on file  . Highest education level: Not on file  Occupational History  . Not on file  Tobacco Use  . Smoking status: Never Smoker  Substance and Sexual Activity  . Alcohol use: Not on file  . Drug use: Not on file  . Sexual activity: Not on file  Other Topics Concern  . Not on file  Social History Narrative  . Not on file   Social Determinants of Health   Financial Resource Strain:   . Difficulty of Paying Living Expenses:   Food Insecurity:   . Worried About Programme researcher, broadcasting/film/video in the Last Year:   . Barista in the Last Year:   Transportation Needs:   . Freight forwarder (Medical):   Marland Kitchen Lack of Transportation (Non-Medical):   Physical Activity:   . Days of Exercise per Week:   . Minutes of Exercise per Session:   Stress:   . Feeling of Stress :   Social Connections:   . Frequency of Communication with Friends and Family:   . Frequency of Social Gatherings with Friends and Family:   . Attends Religious Services:   . Active Member of Clubs or Organizations:   . Attends Banker Meetings:   Marland Kitchen Marital Status:    Intimate Partner Violence:   . Fear of Current or Ex-Partner:   . Emotionally Abused:   Marland Kitchen Physically Abused:   . Sexually Abused:     History reviewed. No pertinent family history.  BP (!) 152/94   Ht 5\' 9"  (1.753 m)   Wt 162 lb (73.5 kg)   BMI 23.92 kg/m   Review of Systems: See HPI above.     Objective:  Physical Exam:  Gen: NAD, comfortable in exam room  Left knee: Approximately 3x2cm superficial mobile mass superolateral knee lateral to quad tendon.  No other gross deformity, ecchymoses, effusion. No TTP. FROM with 5/5 strength. Negative ant/post drawers. Negative valgus/varus testing. Negative lachmans. Negative mcmurrays, apleys. NV intact distally.   MSK u/s left knee:  No effusion.  Superficial mass anechoic without concerning features measuring 3.22 x 1.76cm.  No neovascularity.   Assessment & Plan:  1. Left knee cyst - No evidence intraarticular pathology.  Mass a simple cyst.  Discussed conservative treatment vs. Aspiration.  Patient would like to go ahead with aspiration.  Discussed icing, compression.  No restriction on activities.  F/u in 1 month if this reaccumulates.  After informed written consent timeout was performed, patient was lying supine on exam table.  Left knee was prepped with alcohol swab.  Utilizing superolateral approach, 73mL of bupivicaine was used for  local anesthesia.  Then using an 18g needle on 10cc syringe,1.49mL of thick clear fluid was aspirated from superficial left knee cyst. Patient tolerated procedure well without immediate complications

## 2020-01-10 ENCOUNTER — Ambulatory Visit: Payer: Self-pay

## 2020-01-10 ENCOUNTER — Other Ambulatory Visit: Payer: Self-pay

## 2020-01-10 ENCOUNTER — Ambulatory Visit (INDEPENDENT_AMBULATORY_CARE_PROVIDER_SITE_OTHER): Payer: Self-pay | Admitting: Family Medicine

## 2020-01-10 ENCOUNTER — Encounter: Payer: Self-pay | Admitting: Family Medicine

## 2020-01-10 VITALS — BP 134/80 | Ht 69.0 in | Wt 158.0 lb

## 2020-01-10 DIAGNOSIS — M25562 Pain in left knee: Secondary | ICD-10-CM

## 2020-01-10 NOTE — Progress Notes (Signed)
PCP: Jeanann Lewandowsky, MD  Subjective:   HPI: Patient is a 54 y.o. male here for reevaluation of a cyst on his L knee. He was seen here previously for this, and at that time it was aspirated. He states that it doesn't seem like it has gotten much larger, but that it is still bothersome to him. He denies any pain, weakness, or any new symptoms. He would like to trial another aspiration. Denies any new warmth, redness or drainage.   History reviewed. No pertinent past medical history.  No current outpatient medications on file prior to visit.   No current facility-administered medications on file prior to visit.    History reviewed. No pertinent surgical history.  No Known Allergies  Social History   Socioeconomic History  . Marital status: Married    Spouse name: Not on file  . Number of children: Not on file  . Years of education: Not on file  . Highest education level: Not on file  Occupational History  . Not on file  Tobacco Use  . Smoking status: Never Smoker  Substance and Sexual Activity  . Alcohol use: Not on file  . Drug use: Not on file  . Sexual activity: Not on file  Other Topics Concern  . Not on file  Social History Narrative  . Not on file   Social Determinants of Health   Financial Resource Strain:   . Difficulty of Paying Living Expenses:   Food Insecurity:   . Worried About Programme researcher, broadcasting/film/video in the Last Year:   . Barista in the Last Year:   Transportation Needs:   . Freight forwarder (Medical):   Marland Kitchen Lack of Transportation (Non-Medical):   Physical Activity:   . Days of Exercise per Week:   . Minutes of Exercise per Session:   Stress:   . Feeling of Stress :   Social Connections:   . Frequency of Communication with Friends and Family:   . Frequency of Social Gatherings with Friends and Family:   . Attends Religious Services:   . Active Member of Clubs or Organizations:   . Attends Banker Meetings:   Marland Kitchen Marital  Status:   Intimate Partner Violence:   . Fear of Current or Ex-Partner:   . Emotionally Abused:   Marland Kitchen Physically Abused:   . Sexually Abused:     History reviewed. No pertinent family history.  BP 134/80   Ht 5\' 9"  (1.753 m)   Wt 158 lb (71.7 kg)   BMI 23.33 kg/m   Review of Systems: See HPI above.     Objective:  Physical Exam:  Gen: NAD, comfortable in exam room Left knee: Approximately 3cmx2cm superficial mobile mass superolateral knee lateral to quad tendon.  No other gross deformity, ecchymoses, effusion. Nontender to palpation. Full ROM with 5/5 strength. Neurovascularly intact.   MSK u/s left knee:  perhaps very trace effusion.  Superficial anechoic mass without concerning features. No neovascularity.   Assessment & Plan:  1. Left knee cyst - Pt with persistent mass - possible connection to suprapatellar pouch though not clear on ultrasound. We discussed aspirating again vs. Aspirating with CSI vs. Referral to surgery for consideration of excision. Patient would like to go ahead with aspiration w/o CSI.  Discussed icing, compression.  No restriction on activities.  F/u if this reaccumulates.  After informed written consent timeout was performed, patient was seated on exam table.  Left knee was prepped with alcohol swab.  Approaching from the inferomedial aspect of the cyst, 54mL of bupivicaine was used for local anesthesia.  Then using an 18g needle on 10cc syringe,~75mL of thick clear fluid was aspirated from superficial left knee cyst. Repeat US exam postprocedurally showed no fluid remaining. Patient tolerated procedure well without immediate complications.

## 2020-03-18 ENCOUNTER — Ambulatory Visit (INDEPENDENT_AMBULATORY_CARE_PROVIDER_SITE_OTHER): Payer: Self-pay | Admitting: Family Medicine

## 2020-03-18 ENCOUNTER — Other Ambulatory Visit: Payer: Self-pay

## 2020-03-18 VITALS — BP 152/96 | Ht 68.0 in | Wt 164.0 lb

## 2020-03-18 DIAGNOSIS — G8929 Other chronic pain: Secondary | ICD-10-CM

## 2020-03-18 DIAGNOSIS — M545 Low back pain, unspecified: Secondary | ICD-10-CM

## 2020-03-18 NOTE — Patient Instructions (Signed)
This is most consistent with a chronic lumbar strain though a disc herniation is also a lower possibility. Ok to take tylenol for baseline pain relief (1-2 extra strength tabs 3x/day) Aleve 2 tabs twice a day with food for pain and inflammation (if you do not have stomach or kidney issues). Stay as active as possible. Do home exercises and stretches as directed - hold each for 20-30 seconds and do each one three times. Consider massage, chiropractor, physical therapy, and/or acupuncture. Physical therapy has been shown to be helpful while the others have mixed results. Strengthening of low back muscles, abdominal musculature are key for long term pain relief. Consider MRI of lumbar spine. Follow up with me in 1 month or following the MRI to go over results in a no charge visit.

## 2020-03-19 ENCOUNTER — Encounter: Payer: Self-pay | Admitting: Family Medicine

## 2020-03-19 NOTE — Progress Notes (Signed)
PCP: Adam Lewandowsky, MD  Subjective:   HPI: Patient is a 54 y.o. male here for low back pain.  Patient reports he started having right sided low back pain in 2018 Was doing a [employer] race lifting an atlas ball weighing over 90 pounds when this slipped and he tried to catch it. Felt a sharp pain in right side of low back but no radiation into extremities. Since then he's had off and on pain in this area but constant mild stiffness. Feels like he needs to stretch especially in the morning. This really worsened last Thursday and Friday where he felt he couldn't get out of bed. No new injury. Unable to run due to pain. Has not had imaging for this in the past. Had an adjustment with chiropractor to try to help but not much benefit with this. No numbness/tingling.  History reviewed. No pertinent past medical history.  No current outpatient medications on file prior to visit.   No current facility-administered medications on file prior to visit.    History reviewed. No pertinent surgical history.  No Known Allergies  Social History   Socioeconomic History  . Marital status: Married    Spouse name: Not on file  . Number of children: Not on file  . Years of education: Not on file  . Highest education level: Not on file  Occupational History  . Not on file  Tobacco Use  . Smoking status: Never Smoker  Substance and Sexual Activity  . Alcohol use: Not on file  . Drug use: Not on file  . Sexual activity: Not on file  Other Topics Concern  . Not on file  Social History Narrative  . Not on file   Social Determinants of Health   Financial Resource Strain:   . Difficulty of Paying Living Expenses: Not on file  Food Insecurity:   . Worried About Programme researcher, broadcasting/film/video in the Last Year: Not on file  . Ran Out of Food in the Last Year: Not on file  Transportation Needs:   . Lack of Transportation (Medical): Not on file  . Lack of Transportation (Non-Medical): Not on file   Physical Activity:   . Days of Exercise per Week: Not on file  . Minutes of Exercise per Session: Not on file  Stress:   . Feeling of Stress : Not on file  Social Connections:   . Frequency of Communication with Friends and Family: Not on file  . Frequency of Social Gatherings with Friends and Family: Not on file  . Attends Religious Services: Not on file  . Active Member of Clubs or Organizations: Not on file  . Attends Banker Meetings: Not on file  . Marital Status: Not on file  Intimate Partner Violence:   . Fear of Current or Ex-Partner: Not on file  . Emotionally Abused: Not on file  . Physically Abused: Not on file  . Sexually Abused: Not on file    History reviewed. No pertinent family history.  BP (!) 152/96   Ht 5\' 8"  (1.727 m)   Wt 164 lb (74.4 kg)   BMI 24.94 kg/m   [facility] 03/18/2020  Frequency of aerobic exercise (# of days/week) 6  Average time in minutes 45  Frequency of strengthening activities (# of days/week) 6    No flowsheet data found.  Review of Systems: See HPI above.     Objective:  Physical Exam:  Gen: NAD, comfortable in exam room  Back: No gross deformity, scoliosis. No paraspinal TTP .  No midline or bony TTP. FROM with mild pain on flexion, trunk rotation. Strength LEs 5/5 all muscle groups.   2+ MSRs in patellar and achilles tendons, equal bilaterally. Negative SLRs. Sensation intact to light touch bilaterally. Negative logroll bilateral hips Negative fabers and piriformis stretches.   Assessment & Plan:  1. Low back pain - consistent with chronic lumbar strain vs less likely disc herniation without radiculopathy.  Tylenol, aleve.  Home exercises and stretches reviewed though he's been doing these in the past and is very physically active doing core exercises.  Given length of time he's had this and recent worsening recommend going ahead with MRI to further assess.  Of note he had some  kind of local injection including homeopathy today.  Doubtful this will provide relief.  F/u after MRI to review results and next steps.

## 2020-04-07 ENCOUNTER — Other Ambulatory Visit: Payer: Self-pay
